# Patient Record
Sex: Male | Born: 2000 | Race: White | Hispanic: No | Marital: Single | Smoking: Never smoker
Health system: Southern US, Community
[De-identification: ages and names within clinical notes are randomized; demographics above are authoritative.]

## PROBLEM LIST (undated history)

## (undated) HISTORY — PX: WISDOM TOOTH EXTRACTION: SHX21

---
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
## 2020-04-19 IMAGING — CT CT HEAD W/O CM
3 series · 16 of 47 positions shown, 19 images · non-contrast
Comparison: None.

CLINICAL DATA: Syncopal episode in shower. Fall with blunt head
trauma. Headache and dizziness. Initial encounter.

EXAM:
CT HEAD WITHOUT CONTRAST
TECHNIQUE: Contiguous axial images were obtained from the base of the skull
through the vertex without intravenous contrast.

[Series 2: head wo · axial · 0.47mm/px · z∈[-145,-20]mm · 10 of 30 slices shown, 13 images]
[im 3/30  brain]
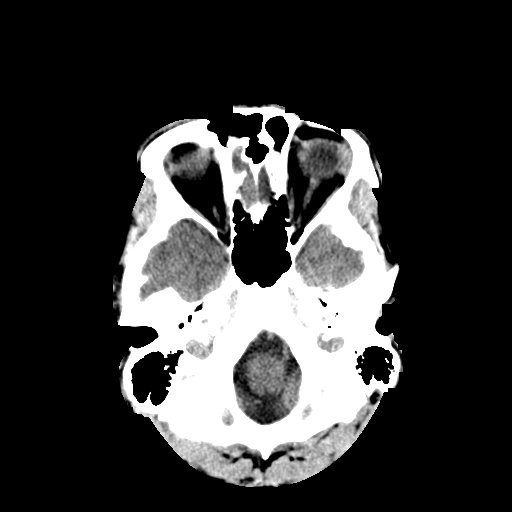
[im 3/30  bone]
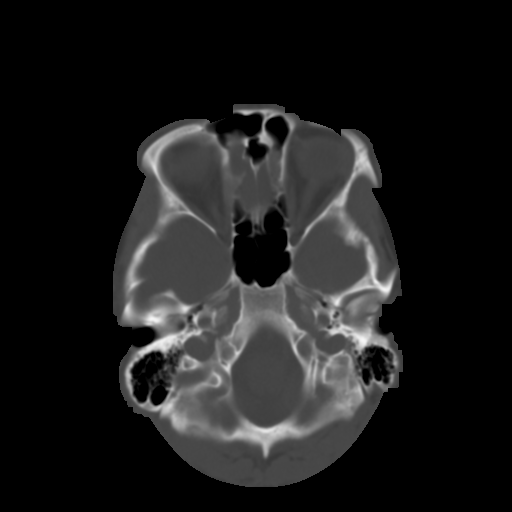
[im 6/30  brain]
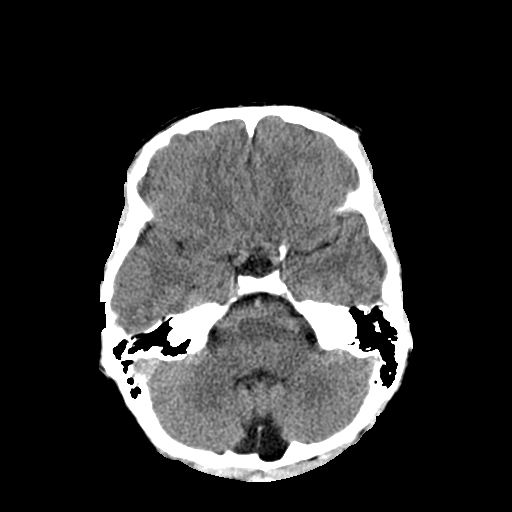
[im 9/30  brain]
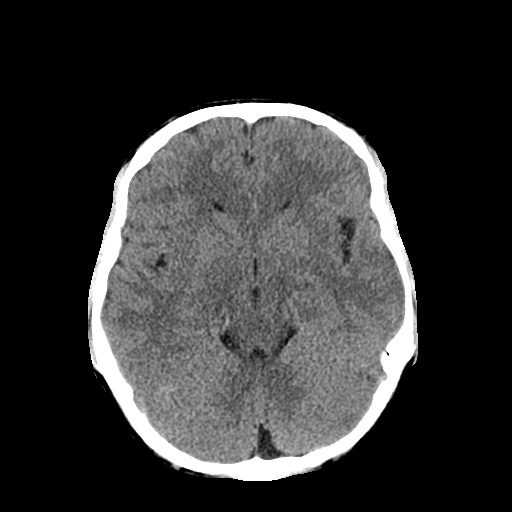
[im 11/30  brain]
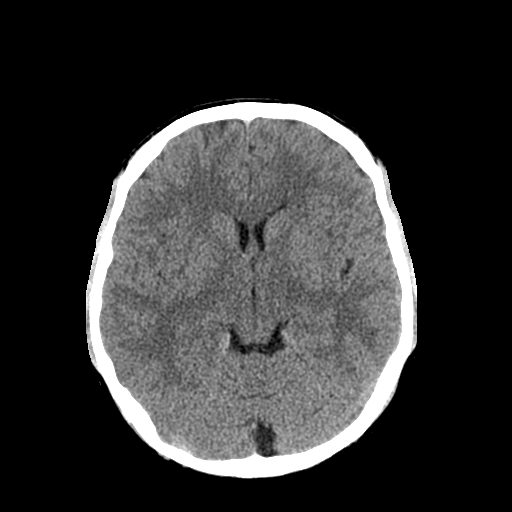
[im 14/30  brain]
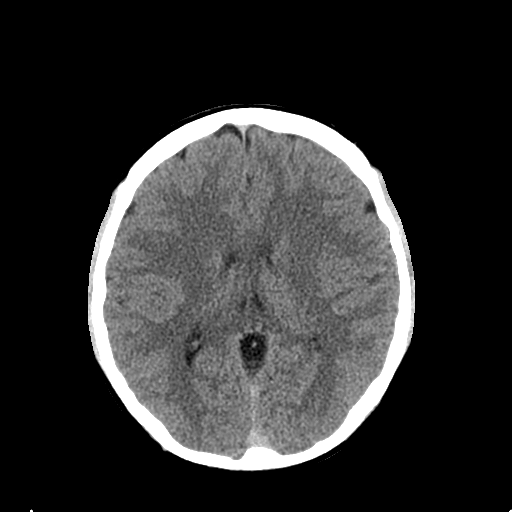
[im 14/30  bone]
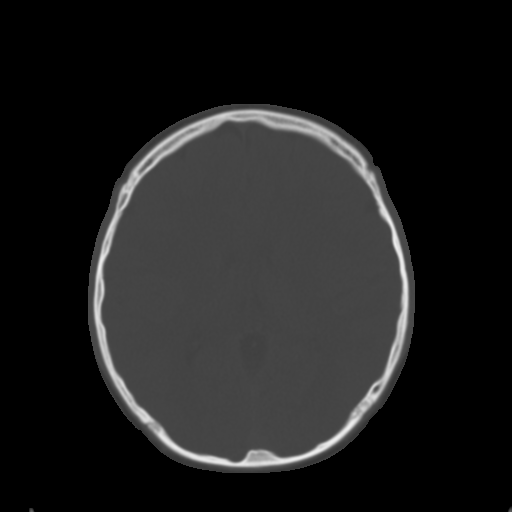
[im 17/30  brain]
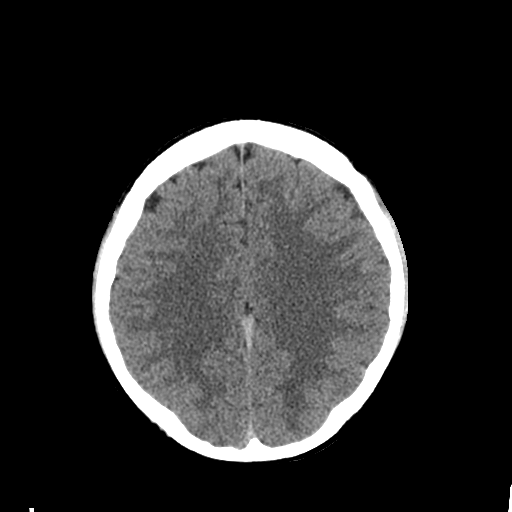
[im 20/30  brain]
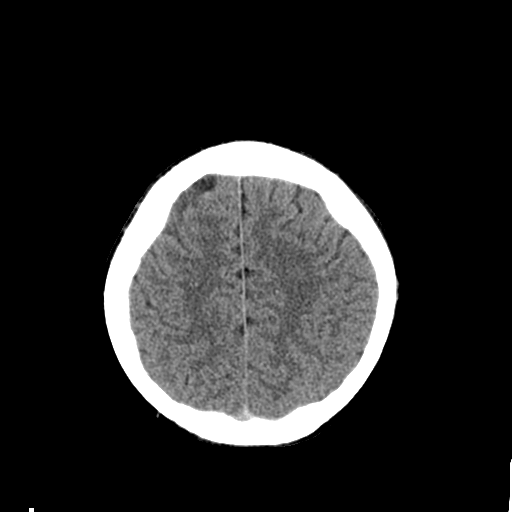
[im 23/30  brain]
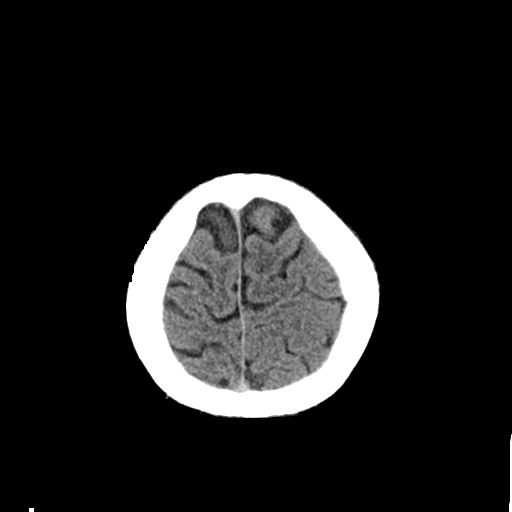
[im 25/30  brain]
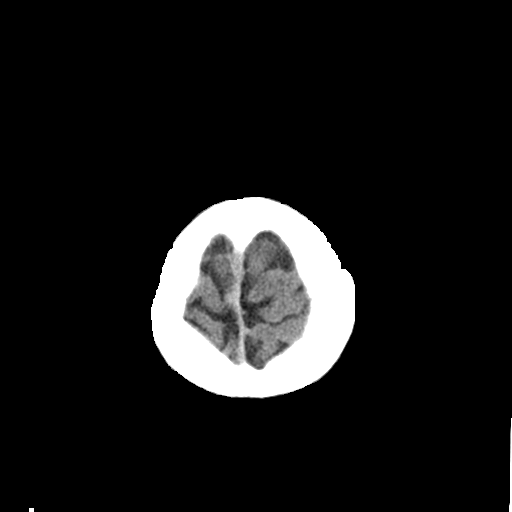
[im 25/30  bone]
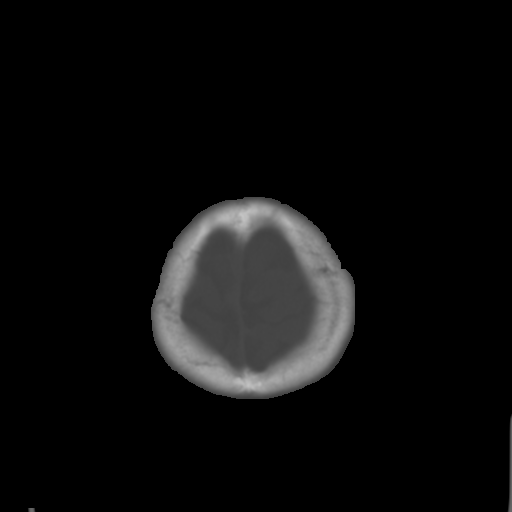
[im 28/30  brain]
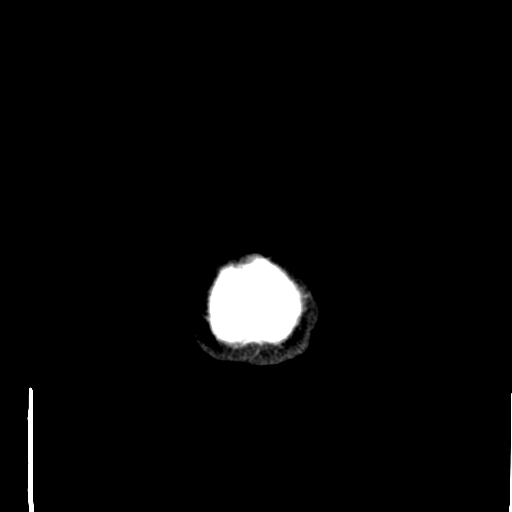

[Series 4: coronal soft tissue · coronal · 0.30mm/px · 3 of 63 slices shown]
[im 21/63  brain]
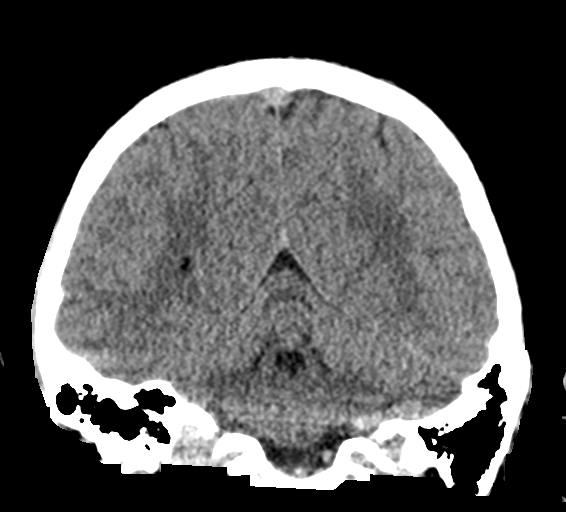
[im 28/63  brain]
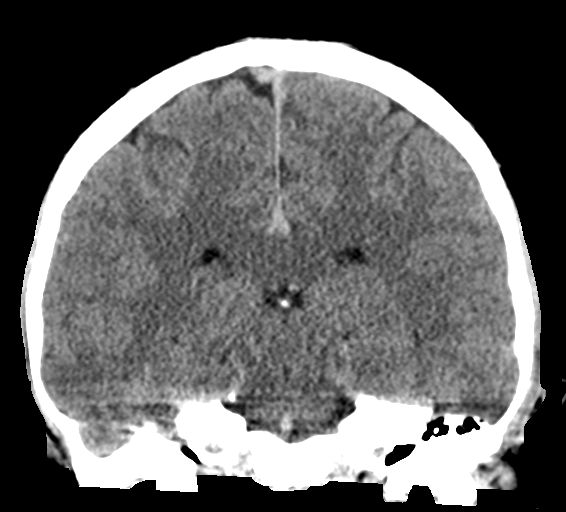
[im 35/63  brain]
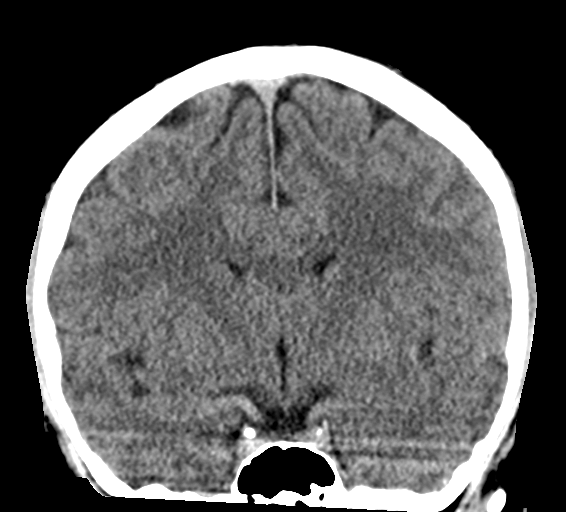

[Series 5: sagittal soft tissue · sagittal · 0.30mm/px · 3 of 59 slices shown]
[im 20/59  brain]
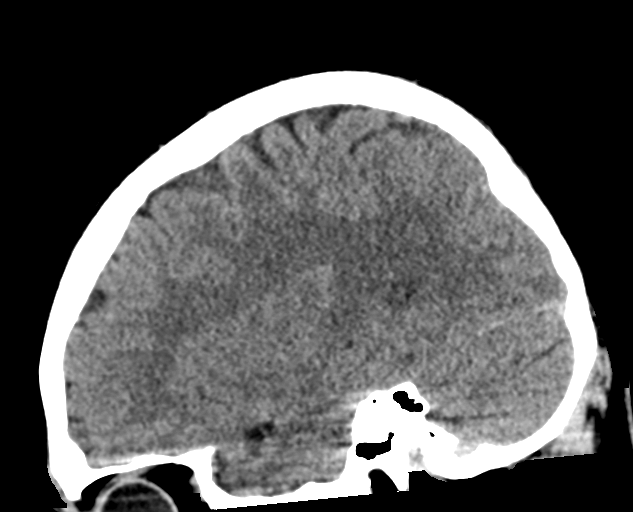
[im 30/59  brain]
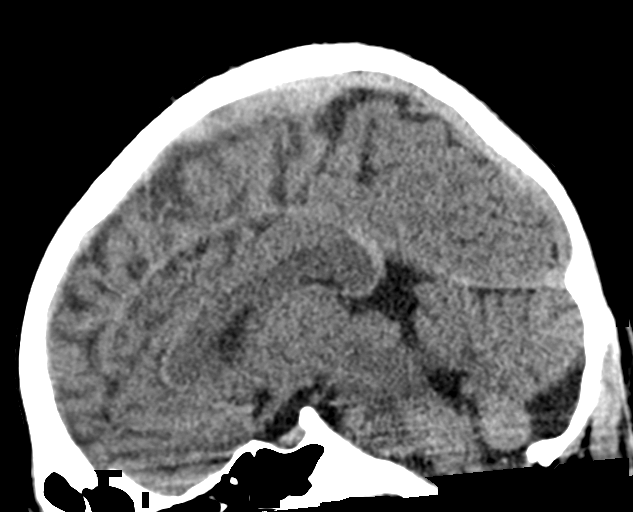
[im 39/59  brain]
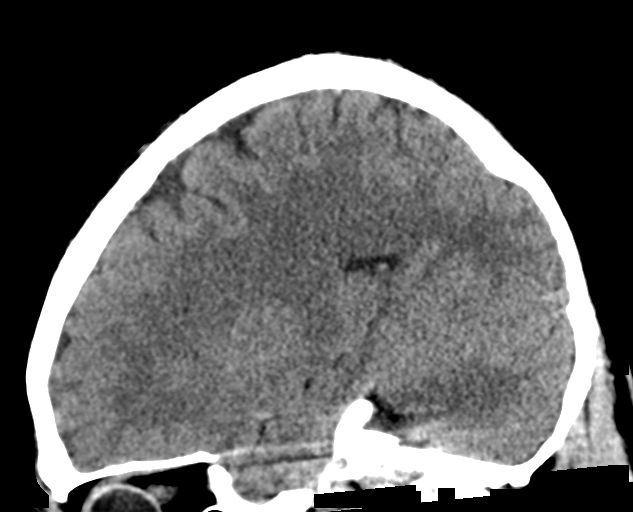

[16 of 47 positions shown; findings below may reference images not displayed]

FINDINGS: Brain: No evidence of acute infarction, hemorrhage, hydrocephalus,
extra-axial collection, or mass lesion/mass effect.

Vascular:  No hyperdense vessel or other acute findings.

Skull: No evidence of fracture or other significant bone
abnormality.

Sinuses/Orbits:  No acute findings.

Other: None.
IMPRESSION: Negative noncontrast head CT.

## 2021-01-20 ENCOUNTER — Ambulatory Visit
Admission: EM | Admit: 2021-01-20 | Discharge: 2021-01-20 | Disposition: A | Discharge status: Home / Self Care | Payer: BC Managed Care – PPO | Attending: Emergency Medicine | Admitting: Emergency Medicine

## 2021-01-20 ENCOUNTER — Other Ambulatory Visit: Payer: Self-pay

## 2021-01-20 ENCOUNTER — Encounter: Payer: Self-pay | Admitting: Emergency Medicine

## 2021-01-20 DIAGNOSIS — J029 Acute pharyngitis, unspecified: Secondary | ICD-10-CM

## 2021-01-20 DIAGNOSIS — Z1152 Encounter for screening for COVID-19: Secondary | ICD-10-CM

## 2021-01-20 LAB — POCT RAPID STREP A (OFFICE): Rapid Strep A Screen: NEGATIVE

## 2021-01-20 NOTE — ED Triage Notes (Signed)
Pt presents with ST that hurts to swallow that started today.

## 2021-01-20 NOTE — Discharge Instructions (Addendum)
Your rapid strep test is negative.     Your COVID test is pending.  You should self quarantine until the test result is back.    Take Tylenol or ibuprofen as needed for fever or discomfort.  Rest and keep yourself hydrated.    Follow-up with your primary care provider if your symptoms are not improving.     

## 2021-01-20 NOTE — ED Provider Notes (Signed)
Donald Lynch    CSN: [id number] Arrival date & time: 01/20/21  1502      History   Chief Complaint Chief Complaint  Patient presents with   Sore Throat    HPI Donald Lynch is a 20 y.o. male.  Patient presents with sore throat that started today.  He also reports mild nonproductive cough.  He requests a strep test and COVID test.  He denies fever, chills, rash, shortness of breath, or other symptoms.  OTC treatment attempted at home.  He denies pertinent medical history.  The history is provided by the patient.   History reviewed. No pertinent past medical history.  There are no problems to display for this patient.   Past Surgical History:  Procedure Laterality Date   WISDOM TOOTH EXTRACTION         Home Medications    Prior to Admission medications   Not on File    Family History History reviewed. No pertinent family history.  Social History Social History   Tobacco Use   Smoking status: Never   Smokeless tobacco: Never  Vaping Use   Vaping Use: Some days  Substance Use Topics   Alcohol use: Never   Drug use: Never     Allergies   Patient has no known allergies.   Review of Systems Review of Systems  Constitutional:  Negative for chills and fever.  HENT:  Positive for sore throat. Negative for ear pain.   Respiratory:  Positive for cough. Negative for shortness of breath.   Cardiovascular:  Negative for chest pain and palpitations.  Gastrointestinal:  Negative for abdominal pain, diarrhea and vomiting.  Skin:  Negative for color change and rash.  All other systems reviewed and are negative.   Physical Exam Triage Vital Signs ED Triage Vitals  Enc Vitals Group     BP      Pulse      Resp      Temp      Temp src      SpO2      Weight      Height      Head Circumference      Peak Flow      Pain Score      Pain Loc      Pain Edu?      Excl. in GC?    No data found.  Updated Vital Signs BP 129/82 (BP Location: Left  Arm)   Pulse 96   Temp 97.8 F (36.6 C) (Oral)   Resp 18   SpO2 96%   Visual Acuity Right Eye Distance:   Left Eye Distance:   Bilateral Distance:    Right Eye Near:   Left Eye Near:    Bilateral Near:     Physical Exam Vitals and nursing note reviewed.  Constitutional:      General: He is not in acute distress.    Appearance: He is well-developed. He is not ill-appearing.  HENT:     Head: Normocephalic and atraumatic.     Right Ear: Tympanic membrane normal.     Left Ear: Tympanic membrane normal.     Nose: Nose normal.     Mouth/Throat:     Mouth: Mucous membranes are moist.     Pharynx: Posterior oropharyngeal erythema present.  Eyes:     Conjunctiva/sclera: Conjunctivae normal.  Cardiovascular:     Rate and Rhythm: Normal rate and regular rhythm.     Heart sounds: Normal heart sounds.  Pulmonary:     Effort: Pulmonary effort is normal. No respiratory distress.     Breath sounds: Normal breath sounds.  Abdominal:     Palpations: Abdomen is soft.     Tenderness: There is no abdominal tenderness.  Musculoskeletal:     Cervical back: Neck supple.  Skin:    General: Skin is warm and dry.  Neurological:     General: No focal deficit present.     Mental Status: He is alert and oriented to person, place, and time.     Gait: Gait normal.  Psychiatric:        Mood and Affect: Mood normal.        Behavior: Behavior normal.     UC Treatments / Results  Labs (all labs ordered are listed, but only abnormal results are displayed) Labs Reviewed  NOVEL CORONAVIRUS, NAA  POCT RAPID STREP A (OFFICE)    EKG   Radiology No results found.  Procedures Procedures (including critical care time)  Medications Ordered in UC Medications - No data to display  Initial Impression / Assessment and Plan / UC Course  I have reviewed the triage vital signs and the nursing notes.  Pertinent labs & imaging results that were available during my care of the patient were  reviewed by me and considered in my medical decision making (see chart for details).   Sore throat.  Rapid strep negative.  COVID pending.  Instructed patient to self quarantine per CDC guidelines.  Discussed symptomatic treatment including Tylenol or ibuprofen, rest, hydration.  Instructed patient to follow up with PCP if symptoms are not improving.  Patient agrees to plan of care.    Final Clinical Impressions(s) / UC Diagnoses   Final diagnoses:  Sore throat     Discharge Instructions      Your rapid strep test is negative.    Your COVID test is pending.  You should self quarantine until the test result is back.    Take Tylenol or ibuprofen as needed for fever or discomfort.  Rest and keep yourself hydrated.    Follow-up with your primary care provider if your symptoms are not improving.           ED Prescriptions   None    PDMP not reviewed this encounter.   Mickie Bail, NP 01/20/21 1534

## 2021-01-21 LAB — SARS-COV-2, NAA 2 DAY TAT

## 2021-01-21 LAB — NOVEL CORONAVIRUS, NAA: SARS-CoV-2, NAA: NOT DETECTED
